# Patient Record
Sex: Male | Born: 1995 | Race: Black or African American | Hispanic: No | Marital: Single
Health system: Midwestern US, Academic
[De-identification: ages and names within clinical notes are randomized; demographics above are authoritative.]

---

## 2020-03-13 ENCOUNTER — Other Ambulatory Visit: Payer: Self-pay

## 2020-03-13 ENCOUNTER — Emergency Department (HOSPITAL_COMMUNITY)
Admission: EM | Admit: 2020-03-13 | Discharge: 2020-03-14 | Disposition: A | Discharge status: Home / Self Care | Payer: BC Managed Care – PPO | Source: Home / Self Care | Attending: Emergency Medicine | Admitting: Emergency Medicine

## 2020-03-13 DIAGNOSIS — T7840XA Allergy, unspecified, initial encounter: Secondary | ICD-10-CM

## 2020-03-13 DIAGNOSIS — T7809XA Anaphylactic reaction due to other food products, initial encounter: Secondary | ICD-10-CM | POA: Diagnosis not present

## 2020-03-13 DIAGNOSIS — R739 Hyperglycemia, unspecified: Secondary | ICD-10-CM | POA: Diagnosis not present

## 2020-03-13 DIAGNOSIS — Z91010 Allergy to peanuts: Secondary | ICD-10-CM | POA: Diagnosis not present

## 2020-03-13 DIAGNOSIS — Z20822 Contact with and (suspected) exposure to covid-19: Secondary | ICD-10-CM | POA: Diagnosis not present

## 2020-03-13 MED ORDER — METHYLPREDNISOLONE SODIUM SUCC 125 MG IJ SOLR
125.0000 mg | Freq: Once | INTRAMUSCULAR | Status: AC
  Administered 2020-03-13: 125 mg via INTRAVENOUS
  Filled 2020-03-13: qty 2

## 2020-03-13 MED ORDER — EPINEPHRINE 0.3 MG/0.3ML IJ SOAJ: 0.3000 mg | INTRAMUSCULAR | 0 refills | Status: DC | PRN

## 2020-03-13 MED ORDER — PREDNISONE 10 MG PO TABS: 20.0000 mg | ORAL_TABLET | Freq: Every day | ORAL | 0 refills | Status: DC

## 2020-03-13 MED ORDER — FAMOTIDINE IN NACL 20-0.9 MG/50ML-% IV SOLN
20.0000 mg | Freq: Once | INTRAVENOUS | Status: AC
  Administered 2020-03-13: 20 mg via INTRAVENOUS
  Filled 2020-03-13: qty 50

## 2020-03-13 NOTE — ED Triage Notes (Signed)
Pt is alert and oriented x4. Ambulatory and talking. Patient arrived POV his friend brought him. Patients stated he bought and ate some cookies from [employer] an din 10 minutes stated having a allergic reaction. His lips started swelling, throat started itching, stomach pain, and SOB. On the way here patient felt numbness in hands.   Patient administered  Epi 0.3mg  50mg  benadryl

## 2020-03-13 NOTE — ED Provider Notes (Signed)
[facility] Provider Note   CSN: [id number] Arrival date & time: 03/13/20  2046     History Chief Complaint  Patient presents with  . Allergic Reaction    [facility] is a 24 y.o. male.  24 year old male with prior medical history as detailed below presents for evaluation of allergic reaction. 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 Patient reports that he ate some cookies earlier this evening from [employer]. Shortly after eating the cookies developed tingling in his lips and hands. He felt like his lips might be swelling. He reports that the symptoms are typical when he has a reaction. He gave himself 50 mg of Benadryl. He used epinephrine x1. He then drove himself to the ED. He reports he is feeling somewhat improved. His dose of epinephrine was given approximately 30 minutes prior to arrival. He denies current shortness of breath or wheezing. He denies nausea. He reports multiple prior episodes of similar reactions. He denies any prior intubation for allergic reaction.  The history is provided by the patient and medical records.  Allergic Reaction Presenting symptoms: itching, rash and swelling   Severity:  Mild Prior allergic episodes:  Food/nut allergies Context: food        No past medical history on file.  There are no problems to display for this patient.        No family history on file.  Social History   Tobacco Use  . Smoking status: Not on file  Substance Use Topics  . Alcohol use: Not on file  . Drug use: Not on file    Home Medications Prior to Admission medications   Not on File    Allergies    Peanut-containing drug products and Other  Review of Systems   Review of Systems  Skin: Positive for itching and rash.  All other systems reviewed and are negative.   Physical Exam Updated Vital Signs BP (!) 152/76 (BP Location: Right Arm)   Pulse 66   Temp 98 F (36.7 C) (Oral)   Resp 12   Ht  5\' 11"  (1.803 m)   Wt 99.8 kg   SpO2 100%   BMI 30.68 kg/m   Physical Exam Vitals and nursing note reviewed.  Constitutional:      General: He is not in acute distress.    Appearance: Normal appearance. He is well-developed.  HENT:     Head: Normocephalic and atraumatic.     Mouth/Throat:     Comments: No significant edema of the lips, tongue, pharyngeal tissue noted.  Normal phonation. No difficulty swallowing or speaking noted.    Eyes:     Conjunctiva/sclera: Conjunctivae normal.     Pupils: Pupils are equal, round, and reactive to light.  Cardiovascular:     Rate and Rhythm: Normal rate and regular rhythm.     Heart sounds: Normal heart sounds.  Pulmonary:     Effort: Pulmonary effort is normal. No respiratory distress.     Breath sounds: Normal breath sounds.  Abdominal:     General: There is no distension.     Palpations: Abdomen is soft.     Tenderness: There is no abdominal tenderness.  Musculoskeletal:        General: No deformity. Normal range of motion.     Cervical back: Normal range of motion and neck supple.  Skin:    General: Skin is warm and dry.  Neurological:     General: No focal deficit present.  Mental Status: He is alert and oriented to person, place, and time. Mental status is at baseline.     ED Results / Procedures / Treatments   Labs (all labs ordered are listed, but only abnormal results are displayed) Labs Reviewed - No data to display  EKG None  Radiology No results found.  Procedures Procedures (including critical care time)  Medications Ordered in ED Medications  famotidine (PEPCID) IVPB 20 mg premix (20 mg Intravenous New Bag/Given 03/13/20 2117)  methylPREDNISolone sodium succinate (SOLU-MEDROL) 125 mg/2 mL injection 125 mg (125 mg Intravenous Given 03/13/20 2113)    ED Course  I have reviewed the triage vital signs and the nursing notes.  Pertinent labs & imaging results that were available during my care of the  patient were reviewed by me and considered in my medical decision making (see chart for details).    MDM Rules/Calculators/A&P                          MDM  Screen complete  [name] was evaluated in Emergency Department on 03/13/2020 for the symptoms described in the history of present illness. He was evaluated in the context of the global COVID-19 pandemic, which necessitated consideration that the patient might be at risk for infection with the SARS-CoV-2 virus that causes COVID-19. Institutional protocols and algorithms that pertain to the evaluation of patients at risk for COVID-19 are in a state of rapid change based on information released by regulatory bodies including the CDC and federal and state organizations. These policies and algorithms were followed during the patient's care in the ED.  Patient is presenting for allergic reaction.  Patient administered Benadryl and epi prior to arrival.  Patient was given steroids and Pepcid upon arrival to the ED.  Following observation in the ED he is improved.  He now desires discharge.  He does understand need for close follow-up.  He is advised to be extremely careful given his reported history of NUT allergy when eating foods.  Importance of close follow-up stressed.  Strict return precautions given and understood.    Final Clinical Impression(s) / ED Diagnoses Final diagnoses:  Allergic reaction, initial encounter    Rx / DC Orders ED Discharge Orders         Ordered    predniSONE (DELTASONE) 10 MG tablet  Daily        03/13/20 2317    EPINEPHrine 0.3 mg/0.3 mL IJ SOAJ injection  As needed        03/13/20 2317           Wynetta Fines, MD 03/13/20 2319

## 2020-03-13 NOTE — Discharge Instructions (Signed)
Please return for any problem.  °

## 2020-03-14 ENCOUNTER — Observation Stay (HOSPITAL_COMMUNITY)
Admission: EM | Admit: 2020-03-14 | Discharge: 2020-03-14 | Disposition: A | Discharge status: Home / Self Care | Payer: BC Managed Care – PPO | Attending: Internal Medicine | Admitting: Internal Medicine

## 2020-03-14 ENCOUNTER — Encounter (HOSPITAL_COMMUNITY): Payer: Self-pay | Admitting: Emergency Medicine

## 2020-03-14 ENCOUNTER — Other Ambulatory Visit: Payer: Self-pay

## 2020-03-14 DIAGNOSIS — T782XXA Anaphylactic shock, unspecified, initial encounter: Secondary | ICD-10-CM | POA: Diagnosis present

## 2020-03-14 DIAGNOSIS — T782XXD Anaphylactic shock, unspecified, subsequent encounter: Secondary | ICD-10-CM

## 2020-03-14 DIAGNOSIS — R739 Hyperglycemia, unspecified: Secondary | ICD-10-CM | POA: Diagnosis present

## 2020-03-14 DIAGNOSIS — Z91010 Allergy to peanuts: Secondary | ICD-10-CM | POA: Insufficient documentation

## 2020-03-14 DIAGNOSIS — Z20822 Contact with and (suspected) exposure to covid-19: Secondary | ICD-10-CM | POA: Insufficient documentation

## 2020-03-14 DIAGNOSIS — T7809XA Anaphylactic reaction due to other food products, initial encounter: Principal | ICD-10-CM | POA: Insufficient documentation

## 2020-03-14 LAB — HIV ANTIBODY (ROUTINE TESTING W REFLEX): HIV Screen 4th Generation wRfx: NONREACTIVE

## 2020-03-14 LAB — CBC WITH DIFFERENTIAL/PLATELET
Abs Immature Granulocytes: 0.04 10*3/uL (ref 0.00–0.07)
Basophils Absolute: 0 10*3/uL (ref 0.0–0.1)
Basophils Relative: 0 %
Eosinophils Absolute: 0 10*3/uL (ref 0.0–0.5)
Eosinophils Relative: 0 %
HCT: 52.9 % — ABNORMAL HIGH (ref 39.0–52.0)
Hemoglobin: 16.5 g/dL (ref 13.0–17.0)
Immature Granulocytes: 0 %
Lymphocytes Relative: 6 %
Lymphs Abs: 0.6 10*3/uL — ABNORMAL LOW (ref 0.7–4.0)
MCH: 26.9 pg (ref 26.0–34.0)
MCHC: 31.2 g/dL (ref 30.0–36.0)
MCV: 86.3 fL (ref 80.0–100.0)
Monocytes Absolute: 0.1 10*3/uL (ref 0.1–1.0)
Monocytes Relative: 1 %
Neutro Abs: 9.6 10*3/uL — ABNORMAL HIGH (ref 1.7–7.7)
Neutrophils Relative %: 93 %
Platelets: 236 10*3/uL (ref 150–400)
RBC: 6.13 MIL/uL — ABNORMAL HIGH (ref 4.22–5.81)
RDW: 14.2 % (ref 11.5–15.5)
WBC: 10.4 10*3/uL (ref 4.0–10.5)
nRBC: 0 % (ref 0.0–0.2)

## 2020-03-14 LAB — COMPREHENSIVE METABOLIC PANEL
ALT: 36 U/L (ref 0–44)
AST: 32 U/L (ref 15–41)
Albumin: 4.5 g/dL (ref 3.5–5.0)
Alkaline Phosphatase: 45 U/L (ref 38–126)
Anion gap: 11 (ref 5–15)
BUN: 14 mg/dL (ref 6–20)
CO2: 23 mmol/L (ref 22–32)
Calcium: 9.1 mg/dL (ref 8.9–10.3)
Chloride: 102 mmol/L (ref 98–111)
Creatinine, Ser: 1.02 mg/dL (ref 0.61–1.24)
GFR, Estimated: >60 mL/min (ref >60)
Glucose, Bld: 170 mg/dL — ABNORMAL HIGH (ref 70–99)
Potassium: 3.6 mmol/L (ref 3.5–5.1)
Sodium: 136 mmol/L (ref 135–145)
Total Bilirubin: 1.1 mg/dL (ref 0.3–1.2)
Total Protein: 7.3 g/dL (ref 6.5–8.1)

## 2020-03-14 LAB — RESPIRATORY PANEL BY RT PCR (FLU A&B, COVID)
Influenza A by PCR: NEGATIVE
Influenza B by PCR: NEGATIVE
SARS Coronavirus 2 by RT PCR: NEGATIVE

## 2020-03-14 MED ORDER — HYDROXYZINE HCL 25 MG PO TABS
25.0000 mg | ORAL_TABLET | Freq: Once | ORAL | Status: AC
  Administered 2020-03-14: 25 mg via ORAL
  Filled 2020-03-14: qty 1

## 2020-03-14 MED ORDER — FAMOTIDINE 20 MG PO TABS: 20.0000 mg | ORAL_TABLET | Freq: Two times a day (BID) | ORAL | 0 refills | Status: AC

## 2020-03-14 MED ORDER — DIPHENHYDRAMINE HCL 25 MG PO TABS: ORAL_TABLET | ORAL | 0 refills | Status: DC

## 2020-03-14 MED ORDER — EPINEPHRINE 0.3 MG/0.3ML IJ SOAJ
0.3000 mg | Freq: Once | INTRAMUSCULAR | Status: AC
  Administered 2020-03-14: 0.3 mg via INTRAMUSCULAR
  Filled 2020-03-14: qty 0.3

## 2020-03-14 MED ORDER — ACETAMINOPHEN 650 MG RE SUPP: 650.0000 mg | Freq: Four times a day (QID) | RECTAL | Status: DC | PRN

## 2020-03-14 MED ORDER — FAMOTIDINE IN NACL 20-0.9 MG/50ML-% IV SOLN
20.0000 mg | Freq: Two times a day (BID) | INTRAVENOUS | Status: DC
  Administered 2020-03-14: 20 mg via INTRAVENOUS
  Filled 2020-03-14: qty 50

## 2020-03-14 MED ORDER — ACETAMINOPHEN 325 MG PO TABS: 650.0000 mg | ORAL_TABLET | Freq: Four times a day (QID) | ORAL | Status: DC | PRN

## 2020-03-14 MED ORDER — DICYCLOMINE HCL 10 MG PO CAPS
10.0000 mg | ORAL_CAPSULE | Freq: Once | ORAL | Status: AC
  Administered 2020-03-14: 10 mg via ORAL
  Filled 2020-03-14: qty 1

## 2020-03-14 MED ORDER — PREDNISONE 20 MG PO TABS: 40.0000 mg | ORAL_TABLET | Freq: Every day | ORAL | 0 refills | Status: AC

## 2020-03-14 MED ORDER — ALBUTEROL SULFATE HFA 108 (90 BASE) MCG/ACT IN AERS
2.0000 | INHALATION_SPRAY | Freq: Once | RESPIRATORY_TRACT | Status: AC
  Administered 2020-03-14: 2 via RESPIRATORY_TRACT
  Filled 2020-03-14: qty 6.7

## 2020-03-14 MED ORDER — EPINEPHRINE 0.3 MG/0.3ML IJ SOAJ: 0.3000 mg | INTRAMUSCULAR | 0 refills | Status: DC | PRN

## 2020-03-14 MED ORDER — DIPHENHYDRAMINE HCL 50 MG/ML IJ SOLN
25.0000 mg | Freq: Once | INTRAMUSCULAR | Status: AC
  Administered 2020-03-14: 25 mg via INTRAVENOUS
  Filled 2020-03-14: qty 1

## 2020-03-14 MED ORDER — METHYLPREDNISOLONE SODIUM SUCC 40 MG IJ SOLR
40.0000 mg | Freq: Every day | INTRAMUSCULAR | Status: DC
  Administered 2020-03-14: 40 mg via INTRAVENOUS
  Filled 2020-03-14: qty 1

## 2020-03-14 NOTE — Discharge Instructions (Signed)
Take Benadryl every 4-6 hours as needed for itching. Take Pepcid twice a day to help with itching/rash. Use a cool cloth to help with itching.  The more you scratch, the more the rash will itch. Return to the emergency room if you develop difficulty breathing, swelling of your lips, tongue, throat, difficulty swallowing, vomiting, any new, worsening, or concerning symptoms.

## 2020-03-14 NOTE — ED Triage Notes (Signed)
Patient is complaining of sob and hives. Patient is able to breathe. Patient was discharged around 30 min ago.

## 2020-03-14 NOTE — ED Provider Notes (Signed)
[facility]-EMERGENCY DEPT Provider Note   CSN: [id number] Arrival date & time: 03/14/20  0045     History Chief Complaint  Patient presents with  . Allergic Reaction    [facility] is a 24 y.o. male presenting for evaluation of shortness of breath.  Patient states earlier this afternoon he ate a cookie and 30 minutes later developed swelling of his lips and tingling in his hands, which is typical of his allergic reaction.  He used his EpiPen and took 50 mg of Benadryl at home.  In the ED, he was given Solu-Medrol and Pepcid, he was off for 4 hours, felt better, and was discharged.  He went home, started to have itching again, took 1 Benadryl pill.  20 minutes ago, he had increased itching and shortness of breath.  He has not taken anything else for her symptoms.  He denies feeling like his lips, tongue, or throat swollen.  He reports mild chest tightness, but also has a history of asthma.  He denies nausea, vomiting, or diarrhea.  He does report feeling gassy, he felt this way prior.  He has never needed to be admitted or intubated for his allergic reactions. H/o tree nut allergy  HPI     History reviewed. No pertinent past medical history.  There are no problems to display for this patient.   History reviewed. No pertinent surgical history.     History reviewed. No pertinent family history.  Social History   Tobacco Use  . Smoking status: Never Smoker  . Smokeless tobacco: Never Used  Vaping Use  . Vaping Use: Never used  Substance Use Topics  . Alcohol use: Never  . Drug use: Never    Home Medications Prior to Admission medications   Medication Sig Start Date End Date Taking? Authorizing Provider  diphenhydrAMINE (BENADRYL) 25 MG tablet Take 1-2 tablets every 4-6 hours as needed for itching or rash 03/14/20   Yasuo Phimmasone, PA-C  EPINEPHrine 0.3 mg/0.3 mL IJ SOAJ injection Inject 0.3 mg into the muscle as needed for anaphylaxis. 03/13/20    Wynetta Fines, MD  famotidine (PEPCID) 20 MG tablet Take 1 tablet (20 mg total) by mouth 2 (two) times daily. Take until itching/rash subsides 03/14/20   Aalaiyah Yassin, PA-C  predniSONE (DELTASONE) 10 MG tablet Take 2 tablets (20 mg total) by mouth daily. 03/13/20   Wynetta Fines, MD    Allergies    Peanut-containing drug products and Other  Review of Systems   Review of Systems  Respiratory: Positive for chest tightness and shortness of breath.   Skin: Positive for rash.  All other systems reviewed and are negative.   Physical Exam Updated Vital Signs BP 128/90   Pulse 83   Temp 98 F (36.7 C) (Oral)   Resp 19   Ht 5\' 11"  (1.803 m)   Wt 99.8 kg   SpO2 100%   BMI 30.68 kg/m   Physical Exam Vitals and nursing note reviewed.  Constitutional:      General: He is not in acute distress.    Appearance: He is well-developed.     Comments: Appears nontoxic  HENT:     Head: Normocephalic and atraumatic.     Mouth/Throat:     Comments: Airway grossly patent.  May be some mild uvular swelling.  No angioedema or swelling of the tongue. Eyes:     Extraocular Movements: Extraocular movements intact.     Conjunctiva/sclera: Conjunctivae normal.  Pupils: Pupils are equal, round, and reactive to light.  Cardiovascular:     Rate and Rhythm: Normal rate and regular rhythm.     Pulses: Normal pulses.  Pulmonary:     Effort: Pulmonary effort is normal. No respiratory distress.     Breath sounds: Wheezing present.     Comments: Scattered expiratory wheezing.  Breathing deeply/heavily.  Sats stable.  Speaking in short sentences. Abdominal:     General: There is no distension.     Palpations: Abdomen is soft. There is no mass.     Tenderness: There is no abdominal tenderness. There is no guarding or rebound.  Musculoskeletal:        General: Normal range of motion.     Cervical back: Normal range of motion and neck supple.  Skin:    General: Skin is warm and dry.      Capillary Refill: Capillary refill takes less than 2 seconds.     Findings: Rash present.  Neurological:     Mental Status: He is alert and oriented to person, place, and time.     ED Results / Procedures / Treatments   Labs (all labs ordered are listed, but only abnormal results are displayed) Labs Reviewed  CBC WITH DIFFERENTIAL/PLATELET - Abnormal; Notable for the following components:      Result Value   RBC 6.13 (*)    HCT 52.9 (*)    Neutro Abs 9.6 (*)    Lymphs Abs 0.6 (*)    All other components within normal limits  COMPREHENSIVE METABOLIC PANEL - Abnormal; Notable for the following components:   Glucose, Bld 170 (*)    All other components within normal limits  RESPIRATORY PANEL BY RT PCR (FLU A&B, COVID)    EKG None  Radiology No results found.  Procedures Procedures (including critical care time)  Medications Ordered in ED Medications  diphenhydrAMINE (BENADRYL) injection 25 mg (25 mg Intravenous Given 03/14/20 0113)  albuterol (VENTOLIN HFA) 108 (90 Base) MCG/ACT inhaler 2 puff (2 puffs Inhalation Given 03/14/20 0114)  dicyclomine (BENTYL) capsule 10 mg (10 mg Oral Given 03/14/20 0237)  hydrOXYzine (ATARAX/VISTARIL) tablet 25 mg (25 mg Oral Given 03/14/20 0246)  EPINEPHrine (EPI-PEN) injection 0.3 mg (0.3 mg Intramuscular Given 03/14/20 1017)    ED Course  I have reviewed the triage vital signs and the nursing notes.  Pertinent labs & imaging results that were available during my care of the patient were reviewed by me and considered in my medical decision making (see chart for details).    MDM Rules/Calculators/A&P                          Patient presenting for evaluation of recurrent allergic reaction symptoms.  On exam, patient with urticaria.  He is breathing deeply/heavily, reports shortness of breath.  He took 1 Benadryl at home, as such we will give another 25 in the ED.  Albuterol for wheezing.  Will watch closely, he may need repeat epi dose.  Case discussed with attending, Dr. Eudelia Bunch evaluated the pt.   On reassessment about 10 minutes after medication, wheezing has improved, but not completely resolved.  Patient states he feels the shortness of breath is better.  He is very drowsy from the Benadryl, but overall condition appears improved.  We will continue to hold on epi and monitor closely.  On reassessment, patient reports shortness of breath is improved.  Urticaria has improved.  He is no longer sleepy  from the Benadryl.  Wheezing has almost completely resolved.  Will continue to monitor.  On reassessment, patient reports shortness of breath has resolved.  He continues to have hives/urticaria, but they are mild relative to when he arrived.  He states he is feeling well enough to go home.  Discussed continued symptomatic treatment for urticaria with H1 and H2 blockers.  Encourage prompt return to the ER with any worsening symptoms.  Informed by RN that upon standing to leave, patient had severe abdominal pain and vomited.  On reassessment, patient reports pain is improved after vomiting.  However I am concerned about his now GI issues in the setting of anaphylaxis.  Will give epi.  As this is patient's second round of epi, and he is still having symptoms approximately 12 hours after ingestion, 2 rounds of epi, multiple other medications, he will likely need prolonged observation and thus admission.  Discussed with patient who is agreeable to plan.  Labs interpreted by me, overall reassuring. Will call for admission.   Discussed with Dr. Toniann Fail from [facility], pt to be admitted.   Final Clinical Impression(s) / ED Diagnoses Final diagnoses:  Anaphylaxis, subsequent encounter    Rx / DC Orders ED Discharge Orders         Ordered    famotidine (PEPCID) 20 MG tablet  2 times daily        03/14/20 0328    diphenhydrAMINE (BENADRYL) 25 MG tablet  Status:  Discontinued        03/14/20 0328    diphenhydrAMINE  (BENADRYL) 25 MG tablet        03/14/20 0330           Charlie Seda, PA-C 03/14/20 0510    Nira Conn, MD 03/14/20 [phone]

## 2020-03-14 NOTE — ED Provider Notes (Addendum)
Attestation: Medical screening examination/treatment/procedure(s) were conducted as a shared visit with non-physician practitioner(s) and myself.  I personally evaluated the patient during the encounter.   Briefly, the patient is a 24 y.o. male with h/o asthma, seen here earlier for allergic reaction that was treated with steroids, pepcid, benadryl, and epi returns for hives and sob.   Vitals:   03/14/20 0245 03/14/20 0300  BP: 135/69 137/77  Pulse: 68 (!) 101  Resp: 11 (!) 22  Temp:    SpO2: 100% 99%    CONSTITUTIONAL:  well-appearing, NAD NEURO:  Alert and oriented x 3, no focal deficits EYES:  pupils equal and reactive ENT/NECK:  trachea midline, no JVD CARDIO:  tachy rate, reg rhythm, well-perfused PULM:  Mild labored breathing, exp wheezing GI/GU:  Abdomen non-distended MSK/SPINE:  No gross deformities, no edema SKIN:  urticaria PSYCH:  Appropriate speech and behavior   EKG Interpretation  Date/Time:    Ventricular Rate:    PR Interval:    QRS Duration:   QT Interval:    QTC Calculation:   R Axis:     Text Interpretation:         24 y.o. male here with pruritic rash and SOB.   On exam, there is no evidence of oral swelling or airway compromise.   Given Benadryl, and albuterl.  Monitored for 2.5 hrs. Patient's rash returned and he had about of emesis.  Given another dose of epi. Admitted for close monitoring.        Nira Conn, MD 03/14/20 [phone]

## 2020-03-14 NOTE — ED Notes (Signed)
Discharge instructions went over with patient and IV removed, pt stood up to get dressed and states his abdomen started to hurt and began vomiting. Pt assisted back into bed. Hives noted diffusely bilaterally over arms, chest, back and legs. Provider notified

## 2020-03-14 NOTE — ED Notes (Signed)
Pt placed on 2L [place] due to oxygen saturation dropping to 88% on room air, pt improved to 100% on 2L [place]

## 2020-03-14 NOTE — Discharge Summary (Addendum)
Physician Discharge Summary  [facility]:[id number] DOB: Apr 01, 1996 DOA: 03/14/2020  PCP: Patient, No Pcp Per  Admit date: 03/14/2020 Discharge date: 03/14/2020  Time spent: 35 minutes  Recommendations for Outpatient Follow-up:   Vaccination; vaccinated  Anaphylactic reaction -Secondary to tree nuts. -Per patient required EpiPen x1, Benadryl, return to hospital for steroid. -All signs and symptoms resolved -We will discharge patient with prescription for set of EpiPen's -Discharge prednisone 40 mg x 5 days  Hyperglycemia -Most likely from steroid use.    Discharge Diagnoses:  Principal Problem:   Anaphylaxis Active Problems:   Hyperglycemia   Discharge Condition: Stable  Diet recommendation: Regular  Filed Weights   03/14/20 0052  Weight: 99.8 kg    History of present illness:  [facility] is a 24 y.o. BM no significant past medical history had experienced swelling of his lips and throat congestion half hour after eating a cookie at a department shows last evening.  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  So he has an EpiPen with him and he gave 1 shot and he came to the ER and was observed for 4 hours and discharged home after being given Solu-Medrol.  After reaching home patient started having hives and difficulty breathing and presents back to the ER.  ED Course: In the ER patient was given another short of EpiPen and Pepcid.  Labs are largely unremarkable except for hyperglycemia likely from the steroid.  Covid test was negative.  Given that patient has biphasic response to the anaphylactic reaction patient admitted for further observation.   Hospital Course:  See above   Cultures   10/15 SARS coronavirus negative 10/15 influenza A/B negative 10/15 HIV screen negative    Discharge Exam: Vitals:   03/14/20 0600 03/14/20 0615 03/14/20 0644 03/14/20 1356  BP: 127/67 124/63 126/63 (!) 129/58  Pulse: 80 81 70 74   Resp: 15 16 20 18   Temp:   98.2 F (36.8 C) 98.3 F (36.8 C)  TempSrc:   Oral Oral  SpO2: 98% 100% 99% 100%  Weight:      Height:        Physical Exam:  General: No acute respiratory distress Eyes: negative scleral hemorrhage, negative anisocoria, negative icterus ENT: Negative Runny nose, negative gingival bleeding, Neck:  Negative scars, masses, torticollis, lymphadenopathy, JVD Lungs: Clear to auscultation bilaterally without wheezes or crackles Cardiovascular: Regular rate and rhythm without murmur gallop or rub normal S1 and S2 Abdomen: negative abdominal pain, nondistended, positive soft, bowel sounds, no rebound, no ascites, no appreciable mass   Discharge Instructions   Allergies as of 03/14/2020      Reactions   Peanut-containing Drug Products Anaphylaxis   Other Other (See Comments)   Tree nuts - Rash      Medication List    TAKE these medications   EPINEPHrine 0.3 mg/0.3 mL Soaj injection Commonly known as: EPI-PEN Inject 0.3 mg into the muscle as needed for anaphylaxis.   famotidine 20 MG tablet Commonly known as: PEPCID Take 1 tablet (20 mg total) by mouth 2 (two) times daily. Take until itching/rash subsides   predniSONE 20 MG tablet Commonly known as: Deltasone Take 2 tablets (40 mg total) by mouth daily with breakfast for 5 days. What changed:   medication strength  how much to take  when to take this      Allergies  Allergen Reactions  . Peanut-Containing Drug Products Anaphylaxis  . Other Other (See Comments)    Tree  nuts - Rash    Follow-up Information    Go to  [facility]-EMERGENCY DEPT.   Specialty: Emergency Medicine Why: As needed, If symptoms worsen Contact information: [address] [id number] mc [place] [id number] [phone]               The results of significant diagnostics from this hospitalization (including imaging, microbiology, ancillary and laboratory) are  listed below for reference.    Significant Diagnostic Studies: No results found.  Microbiology: Recent Results (from the past 240 hour(s))  Respiratory Panel by RT PCR (Flu A&B, Covid) - Nasopharyngeal Swab     Status: None   Collection Time: 03/14/20  4:27 AM   Specimen: Nasopharyngeal Swab  Result Value Ref Range Status   SARS Coronavirus 2 by RT PCR NEGATIVE NEGATIVE Final    Comment: (NOTE) SARS-CoV-2 target nucleic acids are NOT DETECTED.  The SARS-CoV-2 RNA is generally detectable in upper respiratoy specimens during the acute phase of infection. The lowest concentration of SARS-CoV-2 viral copies this assay can detect is 131 copies/mL. A negative result does not preclude SARS-Cov-2 infection and should not be used as the sole basis for treatment or other patient management decisions. A negative result may occur with  improper specimen collection/handling, submission of specimen other than nasopharyngeal swab, presence of viral mutation(s) within the areas targeted by this assay, and inadequate number of viral copies (<131 copies/mL). A negative result must be combined with clinical observations, patient history, and epidemiological information. The expected result is Negative.  Fact Sheet for Patients:  https://www.moore.com/  Fact Sheet for Healthcare Providers:  https://www.young.biz/  This test is no t yet approved or cleared by the [place] FDA and  has been authorized for detection and/or diagnosis of SARS-CoV-2 by FDA under an Emergency Use Authorization (EUA). This EUA will remain  in effect (meaning this test can be used) for the duration of the COVID-19 declaration under Section 564(b)(1) of the Act, 21 U.S.C. section 360bbb-3(b)(1), unless the authorization is terminated or revoked sooner.     Influenza A by PCR NEGATIVE NEGATIVE Final   Influenza B by PCR NEGATIVE NEGATIVE Final    Comment: (NOTE) The Xpert  Xpress SARS-CoV-2/FLU/RSV assay is intended as an aid in  the diagnosis of influenza from Nasopharyngeal swab specimens and  should not be used as a sole basis for treatment. Nasal washings and  aspirates are unacceptable for Xpert Xpress SARS-CoV-2/FLU/RSV  testing.  Fact Sheet for Patients: https://www.moore.com/  Fact Sheet for Healthcare Providers: https://www.young.biz/  This test is not yet approved or cleared by the [place] FDA and  has been authorized for detection and/or diagnosis of SARS-CoV-2 by  FDA under an Emergency Use Authorization (EUA). This EUA will remain  in effect (meaning this test can be used) for the duration of the  Covid-19 declaration under Section 564(b)(1) of the Act, 21  U.S.C. section 360bbb-3(b)(1), unless the authorization is  terminated or revoked. Performed at [facility], [address], [place], [place] [address]      Labs: Basic Metabolic Panel: Recent Labs  Lab 03/14/20 0427  NA 136  K 3.6  CL 102  CO2 23  GLUCOSE 170*  BUN 14  CREATININE 1.02  CALCIUM 9.1   Liver Function Tests: Recent Labs  Lab 03/14/20 0427  AST 32  ALT 36  ALKPHOS 45  BILITOT 1.1  PROT 7.3  ALBUMIN 4.5   No results for input(s): LIPASE, AMYLASE in the  last 168 hours. No results for input(s): AMMONIA in the last 168 hours. CBC: Recent Labs  Lab 03/14/20 0427  WBC 10.4  NEUTROABS 9.6*  HGB 16.5  HCT 52.9*  MCV 86.3  PLT 236   Cardiac Enzymes: No results for input(s): CKTOTAL, CKMB, CKMBINDEX, TROPONINI in the last 168 hours. BNP: BNP (last 3 results) No results for input(s): BNP in the last 8760 hours.  ProBNP (last 3 results) No results for input(s): PROBNP in the last 8760 hours.  CBG: No results for input(s): GLUCAP in the last 168 hours.     Signed:  Carolyne Littles, MD [facility] [phone] pager

## 2020-03-14 NOTE — H&P (Signed)
History and Physical    Thomas Fox YQM:[id number] DOB: Oct 17, 1995 DOA: 03/14/2020  PCP: Patient, No Pcp Per   Patient coming from: Home.  Chief Complaint: Hives.  HPI: Thomas Fox is a 24 y.o. male with no significant past medical history had experienced swelling of his lips and throat congestion half hour after eating a cookie at a department shows last evening.  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  So he has an EpiPen with him and he gave 1 shot and he came to the ER and was observed for 4 hours and discharged home after being given Solu-Medrol.  After reaching home patient started having hives and difficulty breathing and presents back to the ER.  ED Course: In the ER patient was given another short of EpiPen and Pepcid.  Labs are largely unremarkable except for hyperglycemia likely from the steroid.  Covid test was negative.  Given that patient has biphasic response to the anaphylactic reaction patient admitted for further observation.  Review of Systems: As per HPI, rest all negative.   History reviewed. No pertinent past medical history.  History reviewed. No pertinent surgical history.   reports that he has never smoked. He has never used smokeless tobacco. He reports that he does not drink alcohol and does not use drugs.  Allergies  Allergen Reactions  . Peanut-Containing Drug Products Anaphylaxis  . Other Other (See Comments)    Tree nuts - Rash    Family History  Problem Relation Age of Onset  . Diabetes Mellitus II Paternal Uncle     Prior to Admission medications   Medication Sig Start Date End Date Taking? Authorizing Provider  diphenhydrAMINE (BENADRYL) 25 MG tablet Take 1-2 tablets every 4-6 hours as needed for itching or rash 03/14/20   Caccavale, Sophia, PA-C  EPINEPHrine 0.3 mg/0.3 mL IJ SOAJ injection Inject 0.3 mg into the muscle as needed for anaphylaxis. 03/13/20   Wynetta Fines, MD  famotidine (PEPCID) 20 MG  tablet Take 1 tablet (20 mg total) by mouth 2 (two) times daily. Take until itching/rash subsides 03/14/20   Caccavale, Sophia, PA-C  predniSONE (DELTASONE) 10 MG tablet Take 2 tablets (20 mg total) by mouth daily. 03/13/20   Wynetta Fines, MD    Physical Exam: Constitutional: Moderately built and nourished. Vitals:   03/14/20 0445 03/14/20 0515 03/14/20 0530 03/14/20 0600  BP: (!) 120/56 116/64 132/68 127/67  Pulse: 83 84 85 80  Resp: 14 17 13 15   Temp:      TempSrc:      SpO2: 99% 97% 100% 98%  Weight:      Height:       Eyes: Anicteric no pallor. ENMT: No discharge from the ears eyes nose or mouth. Neck: No mass felt.  No neck rigidity. Respiratory: No rhonchi or crepitations. Cardiovascular: S1-S2 heard. Abdomen: Soft nontender bowel sounds present. Musculoskeletal: No edema. Skin: No rash. Neurologic: Alert awake oriented to time place and person.  Moves all extremities. Psychiatric: Appears normal.  Normal affect.   Labs on Admission: I have personally reviewed following labs and imaging studies  CBC: Recent Labs  Lab 03/14/20 0427  WBC 10.4  NEUTROABS 9.6*  HGB 16.5  HCT 52.9*  MCV 86.3  PLT 236   Basic Metabolic Panel: Recent Labs  Lab 03/14/20 0427  NA 136  K 3.6  CL 102  CO2 23  GLUCOSE 170*  BUN 14  CREATININE 1.02  CALCIUM 9.1   GFR:  Estimated Creatinine Clearance: 135.6 mL/min (by C-G formula based on SCr of 1.02 mg/dL). Liver Function Tests: Recent Labs  Lab 03/14/20 0427  AST 32  ALT 36  ALKPHOS 45  BILITOT 1.1  PROT 7.3  ALBUMIN 4.5   No results for input(s): LIPASE, AMYLASE in the last 168 hours. No results for input(s): AMMONIA in the last 168 hours. Coagulation Profile: No results for input(s): INR, PROTIME in the last 168 hours. Cardiac Enzymes: No results for input(s): CKTOTAL, CKMB, CKMBINDEX, TROPONINI in the last 168 hours. BNP (last 3 results) No results for input(s): PROBNP in the last 8760 hours. HbA1C: No  results for input(s): HGBA1C in the last 72 hours. CBG: No results for input(s): GLUCAP in the last 168 hours. Lipid Profile: No results for input(s): CHOL, HDL, LDLCALC, TRIG, CHOLHDL, LDLDIRECT in the last 72 hours. Thyroid Function Tests: No results for input(s): TSH, T4TOTAL, FREET4, T3FREE, THYROIDAB in the last 72 hours. Anemia Panel: No results for input(s): VITAMINB12, FOLATE, FERRITIN, TIBC, IRON, RETICCTPCT in the last 72 hours. Urine analysis: No results found for: COLORURINE, APPEARANCEUR, LABSPEC, PHURINE, GLUCOSEU, HGBUR, BILIRUBINUR, KETONESUR, PROTEINUR, UROBILINOGEN, NITRITE, LEUKOCYTESUR Sepsis Labs: @LABRCNTIP (procalcitonin:4,lacticidven:4) ) Recent Results (from the past 240 hour(s))  Respiratory Panel by RT PCR (Flu A&B, Covid) - Nasopharyngeal Swab     Status: None   Collection Time: 03/14/20  4:27 AM   Specimen: Nasopharyngeal Swab  Result Value Ref Range Status   SARS Coronavirus 2 by RT PCR NEGATIVE NEGATIVE Final    Comment: (NOTE) SARS-CoV-2 target nucleic acids are NOT DETECTED.  The SARS-CoV-2 RNA is generally detectable in upper respiratoy specimens during the acute phase of infection. The lowest concentration of SARS-CoV-2 viral copies this assay can detect is 131 copies/mL. A negative result does not preclude SARS-Cov-2 infection and should not be used as the sole basis for treatment or other patient management decisions. A negative result may occur with  improper specimen collection/handling, submission of specimen other than nasopharyngeal swab, presence of viral mutation(s) within the areas targeted by this assay, and inadequate number of viral copies (<131 copies/mL). A negative result must be combined with clinical observations, patient history, and epidemiological information. The expected result is Negative.  Fact Sheet for Patients:  03/16/20  Fact Sheet for Healthcare Providers:    https://www.moore.com/  This test is no t yet approved or cleared by the https://www.young.biz/ FDA and  has been authorized for detection and/or diagnosis of SARS-CoV-2 by FDA under an Emergency Use Authorization (EUA). This EUA will remain  in effect (meaning this test can be used) for the duration of the COVID-19 declaration under Section 564(b)(1) of the Act, 21 U.S.C. section 360bbb-3(b)(1), unless the authorization is terminated or revoked sooner.     Influenza A by PCR NEGATIVE NEGATIVE Final   Influenza B by PCR NEGATIVE NEGATIVE Final    Comment: (NOTE) The Xpert Xpress SARS-CoV-2/FLU/RSV assay is intended as an aid in  the diagnosis of influenza from Nasopharyngeal swab specimens and  should not be used as a sole basis for treatment. Nasal washings and  aspirates are unacceptable for Xpert Xpress SARS-CoV-2/FLU/RSV  testing.  Fact Sheet for Patients: [place]  Fact Sheet for Healthcare Providers: https://www.moore.com/  This test is not yet approved or cleared by the https://www.young.biz/ FDA and  has been authorized for detection and/or diagnosis of SARS-CoV-2 by  FDA under an Emergency Use Authorization (EUA). This EUA will remain  in effect (meaning this test can be used) for the  duration of the  Covid-19 declaration under Section 564(b)(1) of the Act, 21  U.S.C. section 360bbb-3(b)(1), unless the authorization is  terminated or revoked. Performed at [facility], [address], [place], [place] [address]      Radiological Exams on Admission: No results found.   Assessment/Plan Principal Problem:   Anaphylaxis    1. 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  This time the symptoms happen after eating cookie at the department stools.  Since this is a biphasic response will continue to observe.  Patient's urticaria and lip swelling has  resolved at this time.  Has no difficulty to breathe at the time of my exam.  We will continue to observe and keep patient on steroid and Pepcid. 2. Hyperglycemia likely from steroids.   DVT prophylaxis: SCDs for now. Code Status: Full code. Family Communication: Discussed with patient. Disposition Plan: Home. Consults called: None. Admission status: Observation.   Eduard Clos MD [facility] Pager [phone].  If 7PM-7AM, please contact night-coverage [email or web address] Password [email or web address]  03/14/2020, 6:15 AM

## 2020-07-22 ENCOUNTER — Other Ambulatory Visit: Payer: Self-pay

## 2020-07-22 ENCOUNTER — Emergency Department (HOSPITAL_COMMUNITY)
Admission: EM | Admit: 2020-07-22 | Discharge: 2020-07-22 | Disposition: A | Discharge status: Home / Self Care | Payer: BC Managed Care – PPO | Attending: Emergency Medicine | Admitting: Emergency Medicine

## 2020-07-22 ENCOUNTER — Encounter (HOSPITAL_COMMUNITY): Payer: Self-pay

## 2020-07-22 ENCOUNTER — Emergency Department (HOSPITAL_COMMUNITY): Payer: BC Managed Care – PPO

## 2020-07-22 ENCOUNTER — Emergency Department (HOSPITAL_COMMUNITY)
Admission: EM | Admit: 2020-07-22 | Discharge: 2020-07-23 | Disposition: A | Discharge status: Home / Self Care | Payer: BC Managed Care – PPO | Source: Home / Self Care | Attending: Emergency Medicine | Admitting: Emergency Medicine

## 2020-07-22 ENCOUNTER — Encounter (HOSPITAL_COMMUNITY): Payer: Self-pay | Admitting: Emergency Medicine

## 2020-07-22 DIAGNOSIS — Z91010 Allergy to peanuts: Secondary | ICD-10-CM | POA: Insufficient documentation

## 2020-07-22 DIAGNOSIS — T7840XA Allergy, unspecified, initial encounter: Secondary | ICD-10-CM

## 2020-07-22 DIAGNOSIS — R0602 Shortness of breath: Secondary | ICD-10-CM | POA: Insufficient documentation

## 2020-07-22 DIAGNOSIS — T7801XA Anaphylactic reaction due to peanuts, initial encounter: Secondary | ICD-10-CM | POA: Diagnosis not present

## 2020-07-22 MED ORDER — PREDNISONE 10 MG (21) PO TBPK: ORAL_TABLET | Freq: Every day | ORAL | 0 refills | Status: DC

## 2020-07-22 MED ORDER — METHYLPREDNISOLONE SODIUM SUCC 125 MG IJ SOLR
125.0000 mg | Freq: Once | INTRAMUSCULAR | Status: AC
  Administered 2020-07-22: 125 mg via INTRAVENOUS
  Filled 2020-07-22: qty 2

## 2020-07-22 MED ORDER — FAMOTIDINE IN NACL 20-0.9 MG/50ML-% IV SOLN
20.0000 mg | Freq: Once | INTRAVENOUS | Status: AC
  Administered 2020-07-22: 20 mg via INTRAVENOUS
  Filled 2020-07-22: qty 50

## 2020-07-22 MED ORDER — PREDNISONE 10 MG (21) PO TBPK: ORAL_TABLET | Freq: Every day | ORAL | 0 refills | Status: AC

## 2020-07-22 MED ORDER — EPINEPHRINE 0.3 MG/0.3ML IJ SOAJ: 0.3000 mg | INTRAMUSCULAR | 0 refills | Status: AC | PRN

## 2020-07-22 NOTE — ED Provider Notes (Deleted)
[facility]-EMERGENCY DEPT Provider Note   CSN: [id number] Arrival date & time: 07/22/20  2002     History Chief Complaint  Patient presents with  . Allergic Reaction    Thomas Fox is a 25 y.o. male with a history of anaphylaxis.  Patient presents with chief complaint of allergic reaction.  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  Patient reports that he started developing shortness of breath and felt like his throat was closing.  He reports that he took 50 mg of Benadryl at this time.   Symptoms continue to progress of the next 45 minutes and he therefore used his EpiPen.  Patient denies any hives, rash.  Patient reports that his symptoms have resolved at this time.    Patient denies any nausea, vomiting, pain, lightheadedness, syncope, palpitations.    Per chart review patient had anaphylaxis reaction 02/2020.  Was evaluated in the ER at this time, was discharged and then started having symptoms again.  Upon arrival to hospital for the second time patient was admitted to the hospital for observation overnight.    Allergic Reaction Presenting symptoms: no difficulty swallowing and no rash        History reviewed. No pertinent past medical history.  Patient Active Problem List   Diagnosis Date Noted  . Anaphylaxis 03/14/2020  . Hyperglycemia 03/14/2020    History reviewed. No pertinent surgical history.     Family History  Problem Relation Age of Onset  . Diabetes Mellitus II Paternal Uncle     Social History   Tobacco Use  . Smoking status: Never Smoker  . Smokeless tobacco: Never Used  Vaping Use  . Vaping Use: Never used  Substance Use Topics  . Alcohol use: Never  . Drug use: Never    Home Medications Prior to Admission medications   Medication Sig Start Date End Date Taking? Authorizing Provider  EPINEPHrine 0.3 mg/0.3 mL IJ SOAJ injection Inject 0.3 mg into  the muscle as needed for anaphylaxis. 07/22/20   Haskel Schroeder, PA-C  famotidine (PEPCID) 20 MG tablet Take 1 tablet (20 mg total) by mouth 2 (two) times daily. Take until itching/rash subsides 03/14/20   Caccavale, Sophia, PA-C    Allergies    Peanut-containing drug products and Other  Review of Systems   Review of Systems  HENT: Negative for drooling, sore throat, trouble swallowing and voice change.   Respiratory: Negative for cough and shortness of breath.   Cardiovascular: Negative for chest pain and palpitations.  Gastrointestinal: Negative for abdominal pain, nausea and vomiting.  Skin: Negative for color change, pallor and rash.  Neurological: Negative for dizziness and light-headedness.    Physical Exam Updated Vital Signs BP 131/87 (BP Location: Left Arm)   Pulse 70   Temp (!) 97.5 F (36.4 C) (Oral)   Resp 18   Ht 5\' 11"  (1.803 m)   Wt 107.5 kg   SpO2 99%   BMI 33.05 kg/m   Physical Exam Vitals and nursing note reviewed.  Constitutional:      General: He is not in acute distress.    Appearance: He is not ill-appearing, toxic-appearing or diaphoretic.  HENT:     Head: Normocephalic and atraumatic.     Mouth/Throat:     Pharynx: Oropharynx is clear. Uvula midline. No pharyngeal swelling, oropharyngeal exudate, posterior oropharyngeal erythema or uvula swelling.     Tonsils: No tonsillar exudate or tonsillar  abscesses. 0 on the right. 0 on the left.  Eyes:     General: No scleral icterus.       Right eye: No discharge.        Left eye: No discharge.  Cardiovascular:     Rate and Rhythm: Normal rate.     Heart sounds: Normal heart sounds.  Pulmonary:     Effort: Pulmonary effort is normal. No tachypnea, bradypnea or respiratory distress.     Breath sounds: Normal breath sounds. No decreased breath sounds, wheezing, rhonchi or rales.  Abdominal:     General: Abdomen is flat.     Palpations: Abdomen is soft. There is no mass or pulsatile mass.      Tenderness: There is no abdominal tenderness. There is no guarding or rebound.  Musculoskeletal:     Cervical back: Neck supple.  Skin:    General: Skin is warm and dry.     Coloration: Skin is not pale.     Findings: No erythema or rash.  Neurological:     General: No focal deficit present.     Mental Status: He is alert.  Psychiatric:        Behavior: Behavior is cooperative.     ED Results / Procedures / Treatments   Labs (all labs ordered are listed, but only abnormal results are displayed) Labs Reviewed - No data to display  EKG None  Radiology No results found.  Procedures Procedures   Medications Ordered in ED Medications - No data to display  ED Course  I have reviewed the triage vital signs and the nursing notes.  Pertinent labs & imaging results that were available during my care of the patient were reviewed by me and considered in my medical decision making (see chart for details).    MDM Rules/Calculators/A&P                          Alert 25 year old male in no distress, nontoxic-appearing.  She is able to speak in full complete sentences without difficulty.  Patient able to handle oral secretions without difficulty.  Patient reports that allergy to tree nuts and peanuts.  Reports that he was exposed to tree nuts approximately 2 hours prior.  Patient felt that his throat was closing and had shortness of breath.  Patient took 50 mg of Benadryl proximately 45 minutes later took his EpiPen.  He reports resolution of symptoms at this time.  Patient has history of anaphylaxis reactions.  No swelling noted to oropharynx or tonsils complete exam.  Lungs clear to auscultation bilaterally.  No rash, erythema or hives noted.    Will administer Solu-Medrol and famotidine.  We will monitor the patient for the next 3 hours.    Patient was monitored for 4 hours.  Vitals remained within normal limits.  On serial reexamination patient denies any difficulty breathing,  difficulty swallowing or shortness of breath.  Oxygen saturation in 97% or greater.  Patient was prescribed no acute pain.  Patient will follow up with primary care provider.  Given strict return precautions.  Patient expressed understanding of all instructions.    Final Clinical Impression(s) / ED Diagnoses Final diagnoses:  None    Rx / DC Orders ED Discharge Orders    None       Haskel Schroeder, Cordelia Poche 07/22/20 2136

## 2020-07-22 NOTE — ED Provider Notes (Signed)
[facility]-EMERGENCY DEPT Provider Note   CSN: [id number] Arrival date & time: 07/22/20  1533     History Chief Complaint  Patient presents with  . Allergic Reaction    Thomas Fox is a 25 y.o. male with a history of anaphylaxis.  Patient presents with chief complaint of allergic reaction.  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  Patient reports that he started developing shortness of breath and felt like his throat was closing.  He reports that he took 50 mg of Benadryl at this time.   Symptoms continue to progress of the next 45 minutes and he therefore used his EpiPen.  Patient denies any hives, rash.  Patient reports that his symptoms have resolved at this time.    Patient denies any nausea, vomiting, pain, lightheadedness, syncope, palpitations.    Per chart review patient had anaphylaxis reaction 02/2020.  Was evaluated in the ER at this time, was discharged and then started having symptoms again.  Upon arrival to hospital for the second time patient was admitted to the hospital for observation overnight.   HPI     History reviewed. No pertinent past medical history.  Patient Active Problem List   Diagnosis Date Noted  . Anaphylaxis 03/14/2020  . Hyperglycemia 03/14/2020    History reviewed. No pertinent surgical history.     Family History  Problem Relation Age of Onset  . Diabetes Mellitus II Paternal Uncle     Social History   Tobacco Use  . Smoking status: Never Smoker  . Smokeless tobacco: Never Used  Vaping Use  . Vaping Use: Never used  Substance Use Topics  . Alcohol use: Never  . Drug use: Never    Home Medications Prior to Admission medications   Medication Sig Start Date End Date Taking? Authorizing Provider  EPINEPHrine 0.3 mg/0.3 mL IJ SOAJ injection Inject 0.3 mg into the muscle as needed for anaphylaxis. 03/14/20   Drema Dallas, MD  famotidine  (PEPCID) 20 MG tablet Take 1 tablet (20 mg total) by mouth 2 (two) times daily. Take until itching/rash subsides 03/14/20   Caccavale, Sophia, PA-C    Allergies    Peanut-containing drug products and Other  Review of Systems   Review of Systems  HENT: Negative for drooling, sore throat, trouble swallowing and voice change.   Respiratory: Negative for cough and shortness of breath.   Cardiovascular: Negative for chest pain and palpitations.  Gastrointestinal: Negative for abdominal pain, nausea and vomiting.  Skin: Negative for color change, pallor and rash.  Neurological: Negative for dizziness and light-headedness.    Physical Exam Updated Vital Signs There were no vitals taken for this visit.  Physical Exam Vitals and nursing note reviewed.  Constitutional:      General: He is not in acute distress.    Appearance: He is not ill-appearing, toxic-appearing or diaphoretic.  HENT:     Head: Normocephalic and atraumatic.     Mouth/Throat:     Pharynx: Oropharynx is clear. Uvula midline. No pharyngeal swelling, oropharyngeal exudate, posterior oropharyngeal erythema or uvula swelling.     Tonsils: No tonsillar exudate or tonsillar abscesses. 0 on the right. 0 on the left.  Eyes:     General: No scleral icterus.       Right eye: No discharge.        Left eye: No discharge.  Cardiovascular:     Rate and Rhythm: Normal  rate.     Heart sounds: Normal heart sounds.  Pulmonary:     Effort: Pulmonary effort is normal. No tachypnea, bradypnea or respiratory distress.     Breath sounds: Normal breath sounds. No decreased breath sounds, wheezing, rhonchi or rales.  Abdominal:     General: Abdomen is flat.     Palpations: Abdomen is soft. There is no mass or pulsatile mass.     Tenderness: There is no abdominal tenderness. There is no guarding or rebound.  Musculoskeletal:     Cervical back: Neck supple.  Skin:    General: Skin is warm and dry.     Coloration: Skin is not pale.      Findings: No erythema or rash.  Neurological:     General: No focal deficit present.     Mental Status: He is alert.  Psychiatric:        Behavior: Behavior is cooperative.     ED Results / Procedures / Treatments   Labs (all labs ordered are listed, but only abnormal results are displayed) Labs Reviewed - No data to display  EKG None  Radiology No results found.  Procedures Procedures   Medications Ordered in ED Medications  methylPREDNISolone sodium succinate (SOLU-MEDROL) 125 mg/2 mL injection 125 mg (125 mg Intravenous Given 07/22/20 1616)  famotidine (PEPCID) IVPB 20 mg premix (0 mg Intravenous Stopped 07/22/20 1710)    ED Course  I have reviewed the triage vital signs and the nursing notes.  Pertinent labs & imaging results that were available during my care of the patient were reviewed by me and considered in my medical decision making (see chart for details).    MDM Rules/Calculators/A&P                          Alert 25 year old male in no distress, nontoxic-appearing.  She is able to speak in full complete sentences without difficulty.  Patient able to handle oral secretions without difficulty.  Patient reports that allergy to tree nuts and peanuts.  Reports that he was exposed to tree nuts approximately 2 hours prior.  Patient felt that his throat was closing and had shortness of breath.  Patient took 50 mg of Benadryl proximately 45 minutes later took his EpiPen.  He reports resolution of symptoms at this time.  Patient has history of anaphylaxis reactions.  No swelling noted to oropharynx or tonsils complete exam.  Lungs clear to auscultation bilaterally.  No rash, erythema or hives noted.    Will administer Solu-Medrol and famotidine.  We will monitor the patient for the next 3 hours.    Patient was monitored for 4 hours.  Vitals remained within normal limits.  On serial reexamination patient denies any difficulty breathing, difficulty swallowing or shortness  of breath.  Oxygen saturation in 97% or greater.  Patient was prescribed no acute pain.  Patient will follow up with primary care provider.  Given strict return precautions.  Patient expressed understanding of all instructions.  Final Clinical Impression(s) / ED Diagnoses Final diagnoses:  Allergic reaction, initial encounter    Rx / DC Orders ED Discharge Orders         Ordered    EPINEPHrine 0.3 mg/0.3 mL IJ SOAJ injection  As needed        07/22/20 1918           Berneice Heinrich 07/22/20 2250    Mancel Bale, MD 07/23/20 0900

## 2020-07-22 NOTE — ED Triage Notes (Signed)
Pt BIB EMS c/o allergic reaction to tree nuts. Pt consumed a brownie and thinks that was the culprit. At time of reaction, pt states he felt flushed and shob. Personal epi pen administered. Pt stable now. 18G LAC.

## 2020-07-22 NOTE — Discharge Instructions (Addendum)
Recommend follow-up with primary doctor. If you develop difficulty breathing, chest pain, vomiting, abdominal pain, or other new concerning symptom, return to ER for reassessment

## 2020-07-22 NOTE — Discharge Instructions (Signed)
You came to the emergency department today to be evaluated for your allergic reaction.  You were given a steroid Solu-Medrol and Pepcid.  You were monitored for 3 hours due to your use of epinephrine.  Vital signs remained stable throughout this observation.  You continued to have no symptoms throughout this observation.  Follow-up with your primary care provider.    Get help right away if: You have symptoms of anaphylaxis. These include: Swollen mouth, tongue, or throat. Pain or tightness in your chest. Trouble breathing or shortness of breath. Dizziness or fainting. Severe abdominal pain, vomiting, or diarrhea.

## 2020-07-22 NOTE — ED Provider Notes (Signed)
[facility]-EMERGENCY DEPT Provider Note   CSN: [id number] Arrival date & time: 07/22/20  2002     History Chief Complaint  Patient presents with  . Allergic Reaction    Thomas Fox is a 25 y.o. male.  Presents to the emergency room with concern for allergic reaction.  Patient reports that after he was discharged he felt short of breath while waiting for a ride and decided to take his epinephrine autoinjector.  After receiving the epi, his breathing improved.  When I evaluated patient, he had no ongoing medical complaints.  States that when he was seen earlier for possible anaphylaxis, his complaint was shortness of breath, sensation of throat closing.  HPI     History reviewed. No pertinent past medical history.  Patient Active Problem List   Diagnosis Date Noted  . Anaphylaxis 03/14/2020  . Hyperglycemia 03/14/2020    History reviewed. No pertinent surgical history.     Family History  Problem Relation Age of Onset  . Diabetes Mellitus II Paternal Uncle     Social History   Tobacco Use  . Smoking status: Never Smoker  . Smokeless tobacco: Never Used  Vaping Use  . Vaping Use: Never used  Substance Use Topics  . Alcohol use: Never  . Drug use: Never    Home Medications Prior to Admission medications   Medication Sig Start Date End Date Taking? Authorizing Provider  EPINEPHrine 0.3 mg/0.3 mL IJ SOAJ injection Inject 0.3 mg into the muscle as needed for anaphylaxis. 07/22/20   Haskel Schroeder, PA-C  famotidine (PEPCID) 20 MG tablet Take 1 tablet (20 mg total) by mouth 2 (two) times daily. Take until itching/rash subsides 03/14/20   Caccavale, Sophia, PA-C  predniSONE (STERAPRED UNI-PAK 21 TAB) 10 MG (21) TBPK tablet Take by mouth daily. Take 6 tabs by mouth daily  for 1 day, then 5 tabs for 1 day, then 4 tabs for 1 day, then 3 tabs for 1 day, 2 tabs for 1 day, then 1 tab by mouth daily for 1 day 07/22/20   Milagros Loll, MD     Allergies    Peanut-containing drug products and Other  Review of Systems   Review of Systems  Constitutional: Negative for chills and fever.  HENT: Negative for ear pain and sore throat.   Eyes: Negative for pain and visual disturbance.  Respiratory: Positive for shortness of breath. Negative for cough.   Cardiovascular: Negative for chest pain and palpitations.  Gastrointestinal: Negative for abdominal pain and vomiting.  Genitourinary: Negative for dysuria and hematuria.  Musculoskeletal: Negative for arthralgias and back pain.  Skin: Negative for color change and rash.  Neurological: Negative for seizures and syncope.  All other systems reviewed and are negative.   Physical Exam Updated Vital Signs BP 131/87 (BP Location: Left Arm)   Pulse 70   Temp (!) 97.5 F (36.4 C) (Oral)   Resp 18   Ht 5\' 11"  (1.803 m)   Wt 107.5 kg   SpO2 99%   BMI 33.05 kg/m   Physical Exam Vitals and nursing note reviewed.  Constitutional:      Appearance: He is well-developed and well-nourished.  HENT:     Head: Normocephalic and atraumatic.  Eyes:     Conjunctiva/sclera: Conjunctivae normal.  Cardiovascular:     Rate and Rhythm: Normal rate and regular rhythm.     Heart sounds: No murmur heard.   Pulmonary:     Effort: Pulmonary effort is normal. No  respiratory distress.     Breath sounds: Normal breath sounds.  Abdominal:     Palpations: Abdomen is soft.     Tenderness: There is no abdominal tenderness.  Musculoskeletal:        General: No edema.     Cervical back: Neck supple.  Skin:    General: Skin is warm and dry.  Neurological:     General: No focal deficit present.     Mental Status: He is alert and oriented to person, place, and time.  Psychiatric:        Mood and Affect: Mood and affect and mood normal.        Behavior: Behavior normal.     ED Results / Procedures / Treatments   Labs (all labs ordered are listed, but only abnormal results are  displayed) Labs Reviewed - No data to display  EKG None  Radiology DG Chest 2 View  Result Date: 07/22/2020 CLINICAL DATA:  25 year old male with shortness of breath. EXAM: CHEST - 2 VIEW COMPARISON:  None. FINDINGS: The heart size and mediastinal contours are within normal limits. Both lungs are clear. The visualized skeletal structures are unremarkable. IMPRESSION: No active cardiopulmonary disease. Electronically Signed   By: Elgie Collard M.D.   On: 07/22/2020 23:30    Procedures Procedures   Medications Ordered in ED Medications - No data to display  ED Course  I have reviewed the triage vital signs and the nursing notes.  Pertinent labs & imaging results that were available during my care of the patient were reviewed by me and considered in my medical decision making (see chart for details).    MDM Rules/Calculators/A&P                          25 year old male presents to ER after he had an episode of shortness of breath and concern for possible anaphylaxis and gave himself IM epinephrine.  Had similar episode earlier in the day.  When I assessed patient, he had no ongoing symptoms, stable vital signs, no rash, clear lungs.  Based on his history, it is unclear to me that he had a definite episode of anaphylaxis.  Also endorsed history of asthma.  Observed in ER for a few hours.  No residual symptoms, reassuring repeat physical exam, believe patient can be safely discharged home at this time.  Will provide course of steroids which could help for either allergies or asthma.  Reviewed return precautions and discharged home. rec recheck with pcp.   After the discussed management above, the patient was determined to be safe for discharge.  The patient was in agreement with this plan and all questions regarding their care were answered.  ED return precautions were discussed and the patient will return to the ED with any significant worsening of condition.   Final Clinical  Impression(s) / ED Diagnoses Final diagnoses:  Allergic reaction, initial encounter    Rx / DC Orders ED Discharge Orders         Ordered    predniSONE (STERAPRED UNI-PAK 21 TAB) 10 MG (21) TBPK tablet  Daily,   Status:  Discontinued        07/22/20 2348    predniSONE (STERAPRED UNI-PAK 21 TAB) 10 MG (21) TBPK tablet  Daily        07/22/20 2348           Milagros Loll, MD 07/23/20 1511

## 2020-07-22 NOTE — ED Notes (Signed)
Pt administered epi-pen in waiting room.

## 2020-07-22 NOTE — ED Triage Notes (Signed)
Pt discharged and while waiting for ride says that he began having difficulty breathing. Pt self administered epi-pen prior to previous encounter.

## 2021-05-08 ENCOUNTER — Encounter: Admit: 2021-05-08 | Discharge: 2021-05-08 | Payer: Commercial Managed Care - HMO

## 2021-06-12 ENCOUNTER — Encounter: Admit: 2021-06-12 | Discharge: 2021-06-12 | Payer: Commercial Managed Care - HMO

## 2021-06-26 ENCOUNTER — Encounter: Admit: 2021-06-26 | Discharge: 2021-06-26 | Payer: Commercial Managed Care - HMO

## 2021-07-05 ENCOUNTER — Encounter: Admit: 2021-07-05 | Discharge: 2021-07-05 | Payer: Commercial Managed Care - HMO

## 2021-07-05 ENCOUNTER — Emergency Department
Admit: 2021-07-05 | Discharge: 2021-07-05 | Disposition: A | Discharge status: Home / Self Care | Payer: Commercial Managed Care - HMO

## 2021-07-05 DIAGNOSIS — S060X0A Concussion without loss of consciousness, initial encounter: Secondary | ICD-10-CM

## 2021-07-05 NOTE — ED Notes
26 y/o male presents with PMH ADHD for "concussion." On Wednesday was playing soccer, got hit in the head with the ball, no LOC, no n/v, but reports confusion and blurry vision Currently, experiencing light sensitivity, headache. Headache, anterior head, 3/10, ache, intermittent, no aggravating factors, mild relief with tylenol and ibuprofen. Has been trying to reduce screen time. Today denies: memory loss, difficulty sleeping, difficulty focusing, vision changes. Belongings: cell phone, wallet, keys and will assume responsibility of items. Call light within reach, on monitor, bed in lowest locked position.

## 2021-07-05 NOTE — ED Notes
Discharged, reviewed instructions, no questions, vitals stable, A&Ox3, ambulated, has all belongings.

## 2021-10-30 IMAGING — CR DG CHEST 2V
2 series · 2 of 2 positions shown · non-contrast
Comparison: None.

CLINICAL DATA: 24-year-old male with shortness of breath.

EXAM:
CHEST - 2 VIEW

[w chest pa]
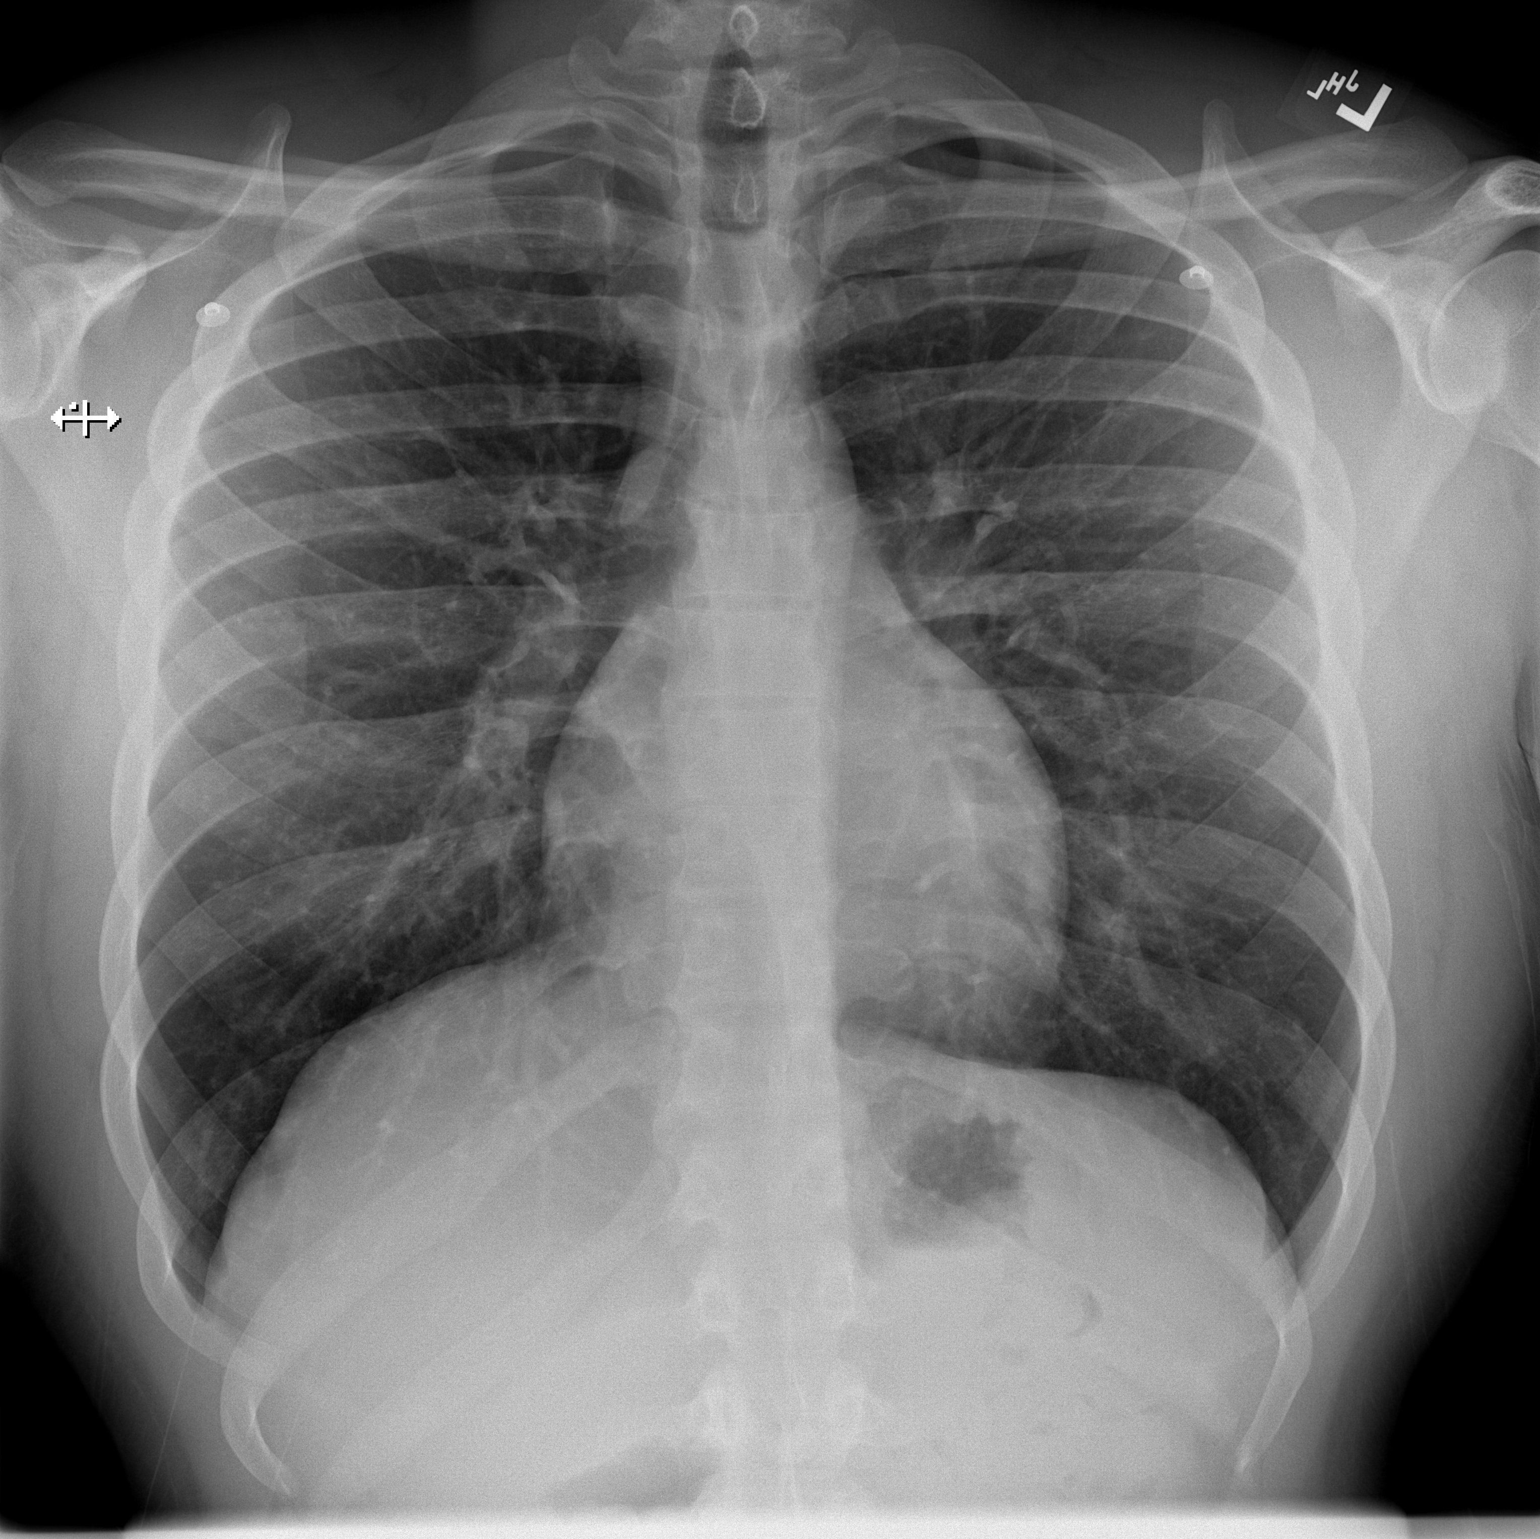

[w chest lat]
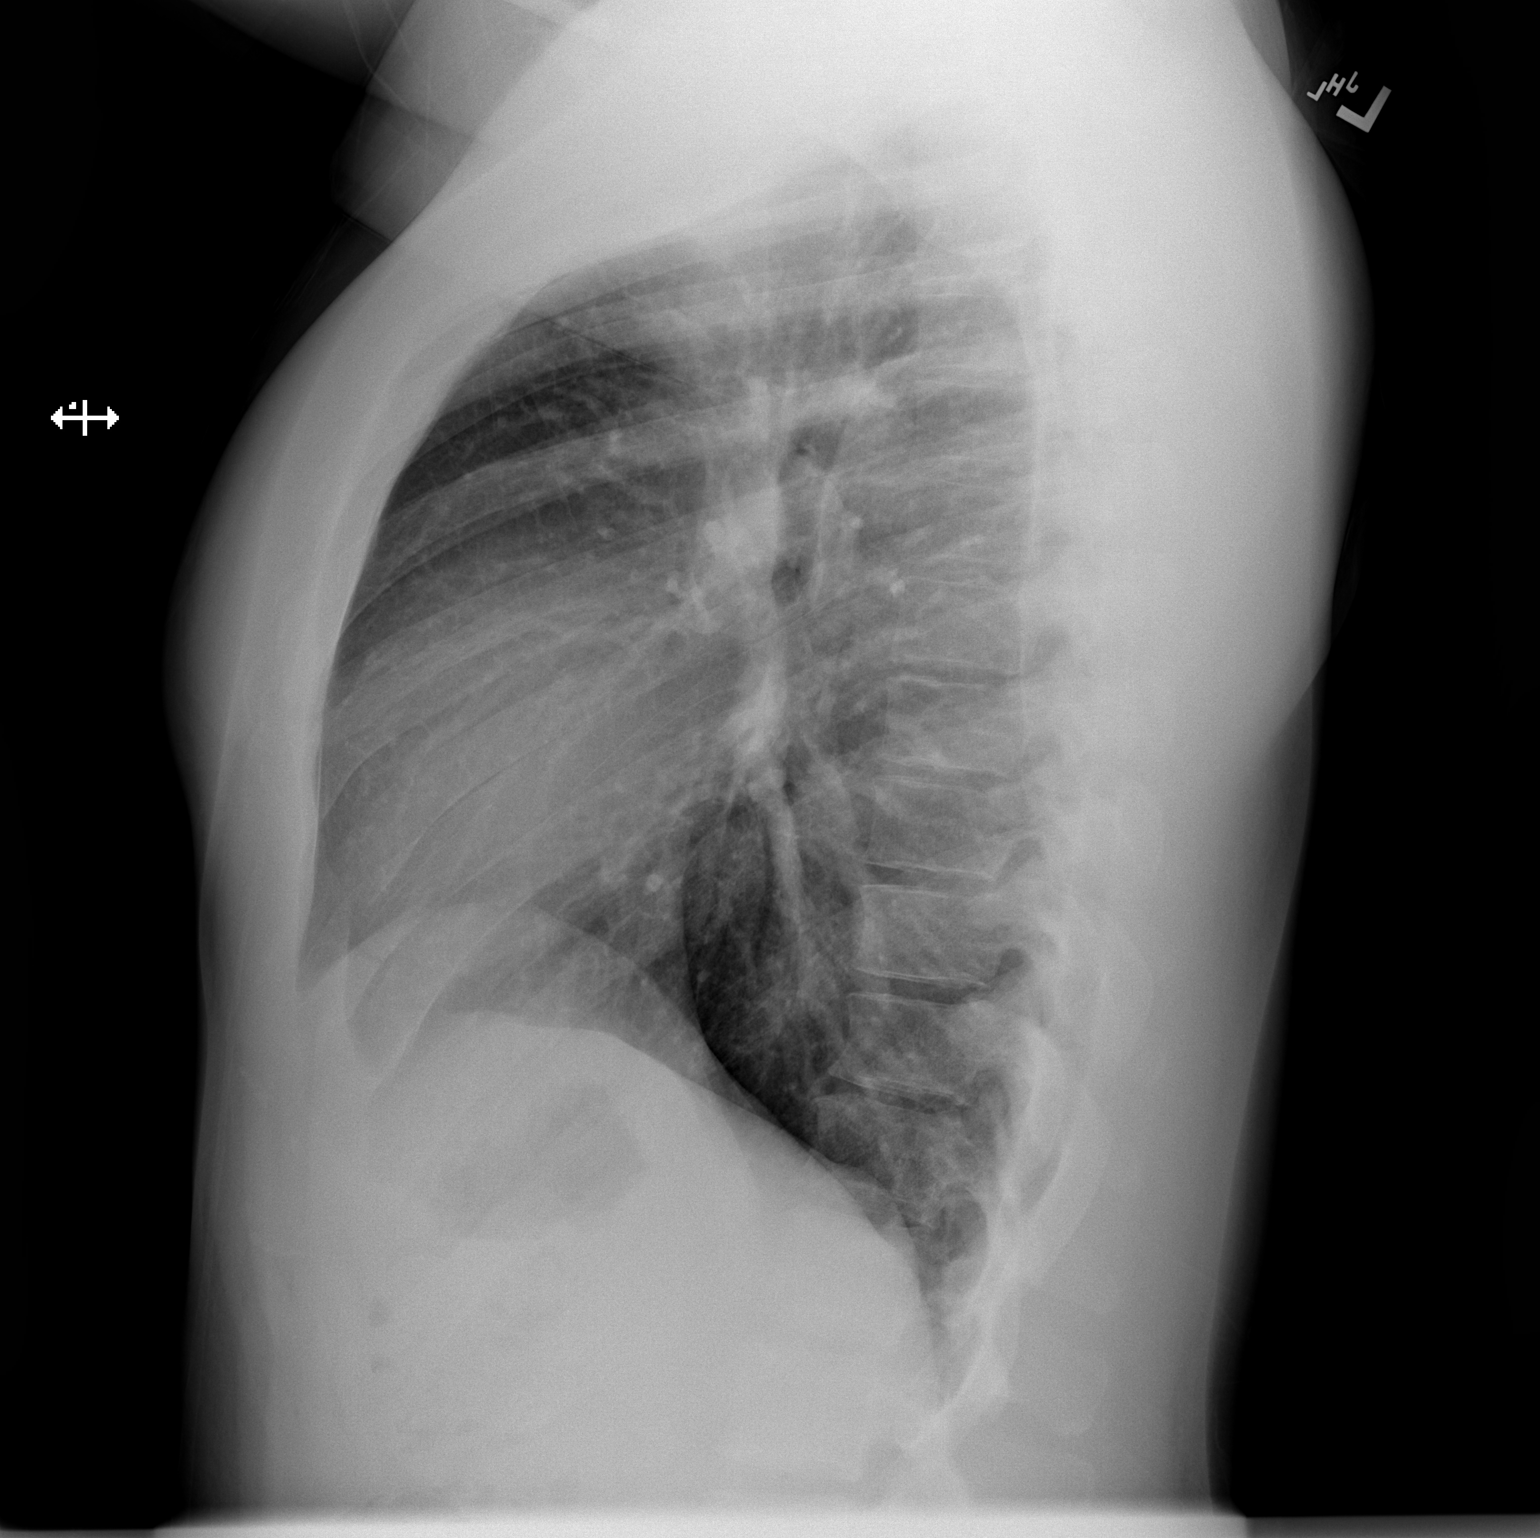

[2 of 2 positions shown; findings below may reference images not displayed]

FINDINGS: The heart size and mediastinal contours are within normal limits.
Both lungs are clear. The visualized skeletal structures are
unremarkable.
IMPRESSION: No active cardiopulmonary disease.

## 2021-11-04 ENCOUNTER — Encounter: Admit: 2021-11-04 | Discharge: 2021-11-04 | Payer: Commercial Managed Care - HMO
